# Patient Record
Sex: Male | Born: 1985 | Hispanic: Yes | Marital: Single | Smoking: Current some day smoker
Health system: Southern US, Community
[De-identification: ages and names within clinical notes are randomized; demographics above are authoritative.]

---

## 2016-05-29 ENCOUNTER — Encounter (HOSPITAL_COMMUNITY): Payer: Self-pay

## 2016-05-29 ENCOUNTER — Emergency Department (HOSPITAL_COMMUNITY): Payer: Self-pay

## 2016-05-29 ENCOUNTER — Emergency Department (HOSPITAL_COMMUNITY)
Admission: EM | Admit: 2016-05-29 | Discharge: 2016-05-29 | Disposition: A | Discharge status: Home / Self Care | Payer: Self-pay | Attending: Emergency Medicine | Admitting: Emergency Medicine

## 2016-05-29 DIAGNOSIS — Y990 Civilian activity done for income or pay: Secondary | ICD-10-CM | POA: Insufficient documentation

## 2016-05-29 DIAGNOSIS — W1830XA Fall on same level, unspecified, initial encounter: Secondary | ICD-10-CM | POA: Insufficient documentation

## 2016-05-29 DIAGNOSIS — S6391XA Sprain of unspecified part of right wrist and hand, initial encounter: Secondary | ICD-10-CM

## 2016-05-29 DIAGNOSIS — Y929 Unspecified place or not applicable: Secondary | ICD-10-CM | POA: Insufficient documentation

## 2016-05-29 DIAGNOSIS — Y939 Activity, unspecified: Secondary | ICD-10-CM | POA: Insufficient documentation

## 2016-05-29 NOTE — ED Notes (Signed)
Declined W/C at D/C and was escorted to lobby by RN. 

## 2016-05-29 NOTE — ED Triage Notes (Signed)
Patient fell yesterday hitting right hand. Complains of pain with ROM and abrasion to knuckles, positive distal pulses

## 2016-05-29 NOTE — ED Provider Notes (Signed)
[facility]-EMERGENCY DEPT Provider Note   CSN: [id number] Arrival date & time: 05/29/16  [id number]  By signing my name below, I, Marnette Burgessyan Andrew Long, attest that this documentation has been prepared under the direction and in the presence of Cyndel Griffey, PA-C. Electronically Signed: Marnette Burgessyan Andrew Long, Scribe. 05/29/2016. 10:53 AM.   History   Chief Complaint No chief complaint on file.  The history is provided by the patient. A language interpreter was used.    HPI Comments:  Adam Sweeney is a 31 y.o. male who presents to the Emergency Department complaining of sudden onset, constant right hand pain s/p a mechanical fall yesterday at work. He reports falling forward at ground level and using his right hand to catch his fall. He states associated abrasions to the right fourth finger. He did not try anything for relief of his pain at home PTA. Exerting the hand and ROM exacerbates his pain. Pt denies any other injuries at this time.   History reviewed. No pertinent past medical history.  There are no active problems to display for this patient.  History reviewed. No pertinent surgical history.  Home Medications    Prior to Admission medications   Not on File    Family History No family history on file.  Social History Social History  Substance Use Topics  . Smoking status: Never Smoker  . Smokeless tobacco: Never Used  . Alcohol use Yes    Allergies   Patient has no known allergies.   Review of Systems Review of Systems  Musculoskeletal: Positive for myalgias.  Skin: Positive for wound.     Physical Exam Updated Vital Signs BP (!) 135/92   Pulse 85   Temp 99.2 F (37.3 C) (Oral)   Resp 18   SpO2 100%   Physical Exam  Constitutional: He is oriented to person, place, and time. He appears well-developed and well-nourished.  HENT:  Head: Normocephalic.  Eyes: Conjunctivae are normal.  Cardiovascular: Normal rate.   Pulmonary/Chest: Effort normal.    Abdominal: He exhibits no distension.  Musculoskeletal: Normal range of motion.  Swelling noted to the MCP joint of the right hand of the second digit. Tenderness to palpation over this joint. Pain with second finger flexion and extension at the MCP joint. Normal finger distally. Cap refill less than 2 seconds. Normal rest of the hand.  Neurological: He is alert and oriented to person, place, and time.  Skin: Skin is warm and dry.  Psychiatric: He has a normal mood and affect.  Nursing note and vitals reviewed.    ED Treatments / Results  DIAGNOSTIC STUDIES:  Oxygen Saturation is 100% on RA, normal by my interpretation.    COORDINATION OF CARE:  10:49 AM Discussed treatment plan with pt at bedside including XR of right hand and pt agreed to plan.  Labs (all labs ordered are listed, but only abnormal results are displayed) Labs Reviewed - No data to display  EKG  EKG Interpretation None       Radiology Dg Hand Complete Right  Result Date: 05/29/2016 CLINICAL DATA:  Pain following fall EXAM: RIGHT HAND - COMPLETE 3+ VIEW COMPARISON:  None. FINDINGS: Frontal, oblique, and lateral views obtained. There is soft tissue swelling dorsally. No fracture or dislocation. Joint spaces appear normal. No erosive change. IMPRESSION: Soft tissue swelling dorsally. No fracture or dislocation. No evident arthropathy. Electronically Signed   By: Bretta BangWilliam  Woodruff III M.D.   On: 05/29/2016 09:37    Procedures Procedures (including critical care  time)  Medications Ordered in ED Medications - No data to display   Initial Impression / Assessment and Plan / ED Course  I have reviewed the triage vital signs and the nursing notes.  Pertinent labs & imaging results that were available during my care of the patient were reviewed by me and considered in my medical decision making (see chart for details).     Patient X-Ray negative for obvious fracture or dislocation.  Pt advised to follow up  with orthopedics. Patient given splint while in ED, conservative therapy recommended and discussed. Patient will be discharged home & is agreeable with above plan. Returns precautions discussed. Pt appears safe for discharge.    Patient in emergency department with right hand injury after a fall yesterday. X-rays negative. Patient does have minimal swelling and tenderness over MCP joint of the second finger. Less likely a sprain. Will treat with immobilization with a finger splint. Advised to ice, elevate, NSAIDs. Follow-up as needed. Patient is neurologically intact and stable for discharge home. No other injuries   Vitals:   05/29/16 0923  BP: (!) 135/92  Pulse: 85  Resp: 18  Temp: 99.2 F (37.3 C)  TempSrc: Oral  SpO2: 100%    Final Clinical Impressions(s) / ED Diagnoses   Final diagnoses:  Hand sprain, right, initial encounter    New Prescriptions New Prescriptions   No medications on file    I personally performed the services described in this documentation, which was scribed in my presence. The recorded information has been reviewed and is accurate.     Jaynie Crumble, PA-C 05/29/16 1128    Derwood Kaplan, MD 05/29/16 1723

## 2016-05-29 NOTE — Discharge Instructions (Signed)
Ice and elevate your hand. Ibuprofen or aleve for pain. Use finger splint as needed. Follow up with family doctor.

## 2018-10-01 ENCOUNTER — Emergency Department (HOSPITAL_COMMUNITY)
Admission: EM | Admit: 2018-10-01 | Discharge: 2018-10-01 | Disposition: A | Discharge status: Home / Self Care | Payer: HRSA Program | Attending: Emergency Medicine | Admitting: Emergency Medicine

## 2018-10-01 ENCOUNTER — Emergency Department (HOSPITAL_COMMUNITY): Payer: HRSA Program

## 2018-10-01 DIAGNOSIS — R05 Cough: Secondary | ICD-10-CM | POA: Diagnosis present

## 2018-10-01 DIAGNOSIS — J1289 Other viral pneumonia: Secondary | ICD-10-CM | POA: Insufficient documentation

## 2018-10-01 DIAGNOSIS — U071 COVID-19: Secondary | ICD-10-CM | POA: Insufficient documentation

## 2018-10-01 DIAGNOSIS — J189 Pneumonia, unspecified organism: Secondary | ICD-10-CM

## 2018-10-01 MED ORDER — ACETAMINOPHEN 325 MG PO TABS
650.0000 mg | ORAL_TABLET | Freq: Once | ORAL | Status: AC
  Administered 2018-10-01: 650 mg via ORAL
  Filled 2018-10-01: qty 2

## 2018-10-01 MED ORDER — AZITHROMYCIN 250 MG PO TABS: 250.0000 mg | ORAL_TABLET | Freq: Every day | ORAL | 0 refills | Status: DC

## 2018-10-01 NOTE — ED Triage Notes (Signed)
Pt has cough, fever body aches for over 8 days. pts girlfriend has same sxs.  Pt also reports a productive cough that makes him feel sob.

## 2018-10-01 NOTE — ED Provider Notes (Signed)
[facility]   CSN: [id number] Arrival date & time: 10/01/18  1232    History   Chief Complaint Chief Complaint  Patient presents with  . Cough  . Generalized Body Aches    HPI Adam Sweeney is a 33 y.o. male.     HPI   33 year old male with no significant past medical history presents today with complaints of cough.  Patient notes over the last 8 days he has had a cough, body aches, fever.  He notes that when he coughs he feels for short of breath.  He also notes that when he talks and gets worked up he gets short of breath.  Patient denies any close sick contacts other than his girlfriend who is having similar symptoms.  He notes taking aspirin in the early morning hours today.  He is a non-smoker.  No past medical history on file.  There are no active problems to display for this patient.   No past surgical history on file.      Home Medications    Prior to Admission medications   Medication Sig Start Date End Date Taking? Authorizing Provider  azithromycin (ZITHROMAX) 250 MG tablet Take 1 tablet (250 mg total) by mouth daily. Take first 2 tablets together, then 1 every day until finished. 10/01/18   Eyvonne MechanicHedges, Nashya Garlington, PA-C    Family History No family history on file.  Social History Social History   Tobacco Use  . Smoking status: Never Smoker  . Smokeless tobacco: Never Used  Substance Use Topics  . Alcohol use: Yes  . Drug use: Not on file     Allergies   Patient has no known allergies.   Review of Systems Review of Systems  All other systems reviewed and are negative.    Physical Exam Updated Vital Signs BP 127/66 (BP Location: Right Arm)   Pulse (!) 115   Temp (!) 101 F (38.3 C) (Oral)   Resp 18   SpO2 99%   Physical Exam Vitals signs and nursing note reviewed.  Constitutional:      Appearance: He is well-developed.  HENT:     Head: Normocephalic and atraumatic.   Eyes:     General: No scleral icterus.       Right eye: No discharge.        Left eye: No discharge.     Conjunctiva/sclera: Conjunctivae normal.     Pupils: Pupils are equal, round, and reactive to light.  Neck:     Musculoskeletal: Normal range of motion.     Vascular: No JVD.     Trachea: No tracheal deviation.  Pulmonary:     Effort: Pulmonary effort is normal. No respiratory distress.     Breath sounds: Normal breath sounds. No stridor. No wheezing or rales.  Neurological:     Mental Status: He is alert and oriented to person, place, and time.     Coordination: Coordination normal.  Psychiatric:        Behavior: Behavior normal.        Thought Content: Thought content normal.        Judgment: Judgment normal.      ED Treatments / Results  Labs (all labs ordered are listed, but only abnormal results are displayed) Labs Reviewed  NOVEL CORONAVIRUS, NAA (HOSPITAL ORDER, SEND-OUT TO REF LAB)    EKG None  Radiology Dg Chest Portable 1 View  Result Date: 10/01/2018 CLINICAL DATA:  Body aches fever and  cough for 8 days EXAM: PORTABLE CHEST 1 VIEW COMPARISON:  None. FINDINGS: The mediastinal contour and cardiac size are within normal limits. Patchy consolidations are identified in the left mid lung left lung base. There is a minimal left pleural effusion. The bony structures are stable. IMPRESSION: Patchy consolidations are identified in the left mid lung and left lung base with minimal left pleural effusion. Findings are suspicious for pneumonia. Electronically Signed   By: Abelardo Diesel M.D.   On: 10/01/2018 15:47    Procedures Procedures (including critical care time)  Medications Ordered in ED Medications  acetaminophen (TYLENOL) tablet 650 mg (650 mg Oral Given 10/01/18 1546)     Initial Impression / Assessment and Plan / ED Course  I have reviewed the triage vital signs and the nursing notes.  Pertinent labs & imaging results that were available during my care  of the patient were reviewed by me and considered in my medical decision making (see chart for details).       Labs:   Imaging: Chest port   Consults:  Therapeutics:  Discharge Meds: azithromycin   Assessment/Plan: 33 year old male presents today with likely viral respiratory infection.  I have high suspicion for COVID-19.  His girlfriend is having similar symptoms and is currently being seen in the emergency room.  He has no significant past medical history is very well-appearing.  He slightly tachycardic and febrile here.  He will be given Tylenol, COVID tested and undergo chest x-ray.  Chest x-ray returned showing likely pneumonia, I still have high suspicion for COVID but will cover with azithromycin for potential bacterial etiology.  Patient will return immediately if he develops any new or worsening signs or symptoms.  Verbalized understanding and agreement to today's plan.  Phone interpreter was used throughout evaluation, in conjunction with his girlfriend who speaks [place].   Final Clinical Impressions(s) / ED Diagnoses   Final diagnoses:  Community acquired pneumonia of left lung, unspecified part of lung    ED Discharge Orders         Ordered    azithromycin (ZITHROMAX) 250 MG tablet  Daily     10/01/18 1631           Okey Regal, PA-C 10/01/18 1738    Pattricia Boss, MD 10/03/18 [phone]

## 2018-10-01 NOTE — Discharge Instructions (Addendum)
Please read attached information. If you experience any new or worsening signs or symptoms please return to the emergency room for evaluation. Please follow-up with your primary care provider or specialist as discussed. Please use medication prescribed only as directed and discontinue taking if you have any concerning signs or symptoms.   °

## 2018-10-03 LAB — NOVEL CORONAVIRUS, NAA (HOSP ORDER, SEND-OUT TO REF LAB; TAT 18-24 HRS): SARS-CoV-2, NAA: DETECTED — AB

## 2018-10-24 ENCOUNTER — Other Ambulatory Visit: Payer: Self-pay

## 2018-10-24 DIAGNOSIS — Z20822 Contact with and (suspected) exposure to covid-19: Secondary | ICD-10-CM

## 2018-10-25 LAB — NOVEL CORONAVIRUS, NAA: SARS-CoV-2, NAA: NOT DETECTED

## 2020-02-12 ENCOUNTER — Ambulatory Visit: Payer: Self-pay | Admitting: Physician Assistant

## 2020-02-12 ENCOUNTER — Ambulatory Visit: Payer: Self-pay | Admitting: Family Medicine

## 2020-07-05 ENCOUNTER — Emergency Department (HOSPITAL_COMMUNITY)
Admission: EM | Admit: 2020-07-05 | Discharge: 2020-07-05 | Disposition: A | Discharge status: Home / Self Care | Payer: Self-pay | Attending: Emergency Medicine | Admitting: Emergency Medicine

## 2020-07-05 ENCOUNTER — Emergency Department (HOSPITAL_COMMUNITY): Payer: Self-pay

## 2020-07-05 ENCOUNTER — Encounter (HOSPITAL_COMMUNITY): Payer: Self-pay | Admitting: Emergency Medicine

## 2020-07-05 ENCOUNTER — Other Ambulatory Visit: Payer: Self-pay

## 2020-07-05 DIAGNOSIS — M25522 Pain in left elbow: Secondary | ICD-10-CM | POA: Insufficient documentation

## 2020-07-05 DIAGNOSIS — M25532 Pain in left wrist: Secondary | ICD-10-CM | POA: Insufficient documentation

## 2020-07-05 DIAGNOSIS — W010XXA Fall on same level from slipping, tripping and stumbling without subsequent striking against object, initial encounter: Secondary | ICD-10-CM | POA: Insufficient documentation

## 2020-07-05 DIAGNOSIS — Y990 Civilian activity done for income or pay: Secondary | ICD-10-CM | POA: Insufficient documentation

## 2020-07-05 DIAGNOSIS — M25512 Pain in left shoulder: Secondary | ICD-10-CM | POA: Insufficient documentation

## 2020-07-05 DIAGNOSIS — M79602 Pain in left arm: Secondary | ICD-10-CM | POA: Insufficient documentation

## 2020-07-05 DIAGNOSIS — Y93F2 Activity, caregiving, lifting: Secondary | ICD-10-CM | POA: Insufficient documentation

## 2020-07-05 DIAGNOSIS — F17200 Nicotine dependence, unspecified, uncomplicated: Secondary | ICD-10-CM | POA: Insufficient documentation

## 2020-07-05 DIAGNOSIS — W19XXXA Unspecified fall, initial encounter: Secondary | ICD-10-CM

## 2020-07-05 MED ORDER — IBUPROFEN 600 MG PO TABS: 600.0000 mg | ORAL_TABLET | Freq: Four times a day (QID) | ORAL | 0 refills | Status: DC | PRN

## 2020-07-05 NOTE — ED Provider Notes (Signed)
Emergency Medicine Provider Triage Evaluation Note  Adam Sweeney 35 y.o. male was evaluated in triage.  Pt complains of LUE pain after a fall that occurred last week.  Reports that he was carrying something and tripped and landed on his left arm.  He states that since then, he has had pain to his left upper extremity from his shoulder all the way down.  He denies any new trauma, injury.  He states it hurts more when he moves it.  Denies any numbness/weakness.  Review of Systems  Positive: Left upper extremity pain Negative: Numbness/weakness.  Physical Exam  BP 134/82   Pulse 70   Temp 98.2 F (36.8 C) (Oral)   Resp 18   Ht 5\' 4"  (1.626 m)   Wt 65.8 kg   SpO2 100%   BMI 24.89 kg/m  Gen:   Awake, no distress  \ HEENT:  Atraumatic.  No midline C-spine tenderness. Resp:  Normal effort  Cardiac:  Normal rate.  2+ radial pulses bilaterally MSK:   Moves extremities without difficulty.  Tenderness palpation of the left shoulder, left upper arm, left elbow, forearm, left wrist.  No deformity or crepitus noted.  Passive range of motion intact but does report pain when doing so.  No tenderness palpation on left upper extremity. Neuro:  Speech clear.  Equal grip strength bilaterally.  5/5 strength bilateral upper extremity.  Medical Decision Making  Medically screening exam initiated at 3:55 AM.  Appropriate orders placed.  Adam Sweeney was informed that the remainder of the evaluation will be completed by another provider, this initial triage assessment does not replace that evaluation, and the importance of remaining in the ED until their evaluation is complete.  Clinical Impression  LUE pain   Portions of this note were generated with Dragon dictation software. Dictation errors may occur despite best attempts at proofreading.     Ursula Beath, PA-C 07/05/20 1726    09/04/20, DO 07/06/20 0008

## 2020-07-05 NOTE — ED Triage Notes (Signed)
Translator/pt stated, I fell last Thursday and hurt my left arm my shoulder all the way down my arm. Unable to move much. Pt tripped and feel and weight of my body fell on the left.

## 2020-07-05 NOTE — ED Provider Notes (Signed)
[name]   CSN: [id number] Arrival date & time: 07/05/20  1632     History Chief Complaint  Patient presents with  . Fall  . Arm Pain   Patient has family member at bedside.  He declines translator and would rather have family translate.  Adam Sweeney is a 35 y.o. male.  HPI   35 year old male presenting to the emergency department today for evaluation of left upper extremity pain.  Patient was at work 1 week ago when he was lifting something lost his balance and fell onto his left upper extremity.  He is reporting pain to the left shoulder, left elbow and left wrist.  He reports some decrease sensation to that side.  He denies any head trauma or loss of consciousness.  He denies any other injuries at this time.    History reviewed. No pertinent past medical history.  There are no problems to display for this patient.   History reviewed. No pertinent surgical history.     No family history on file.  Social History   Tobacco Use  . Smoking status: Current Some Day Smoker  . Smokeless tobacco: Never Used  Substance Use Topics  . Alcohol use: Yes  . Drug use: Not Currently    Home Medications Prior to Admission medications   Medication Sig Start Date End Date Taking? Authorizing Provider  ibuprofen (ADVIL) 600 MG tablet Take 1 tablet (600 mg total) by mouth every 6 (six) hours as needed. 07/05/20  Yes Namiyah Grantham S, PA-C  azithromycin (ZITHROMAX) 250 MG tablet Take 1 tablet (250 mg total) by mouth daily. Take first 2 tablets together, then 1 every day until finished. 10/01/18   Eyvonne Mechanic, PA-C    Allergies    Patient has no known allergies.  Review of Systems   Review of Systems  Constitutional: Negative for fever.  Musculoskeletal:       Lue pain  Skin: Negative for color change.  Neurological: Positive for numbness. Negative for weakness.    Physical Exam Updated Vital Signs BP  (!) 133/96 (BP Location: Right Arm)   Pulse 80   Temp 98.9 F (37.2 C) (Oral)   Resp 17   SpO2 100%   Physical Exam Constitutional:      General: He is not in acute distress.    Appearance: He is well-developed.  Eyes:     Conjunctiva/sclera: Conjunctivae normal.  Cardiovascular:     Rate and Rhythm: Normal rate.  Pulmonary:     Effort: Pulmonary effort is normal.  Musculoskeletal:     Comments: ttp to the left shoulder, left elbow and left wrist. No ttp to the anatomical snuff box. nvi distally. 5/5 strength to the bue. Neg empty can test.  Skin:    General: Skin is warm and dry.  Neurological:     Mental Status: He is alert and oriented to person, place, and time.     ED Results / Procedures / Treatments   Labs (all labs ordered are listed, but only abnormal results are displayed) Labs Reviewed - No data to display  EKG None  Radiology DG Elbow Complete Left  Result Date: 07/05/2020 CLINICAL DATA:  Pain status post fall. EXAM: LEFT FOREARM - 2 VIEW; LEFT HAND - COMPLETE 3+ VIEW; LEFT ELBOW - COMPLETE 3+ VIEW; LEFT SHOULDER - 2+ VIEW COMPARISON:  None. FINDINGS: There is no evidence of fracture or other focal bone lesions. Soft tissues are unremarkable. IMPRESSION: No  acute osseous abnormality involving the patient's left upper extremity from the shoulder to the hand. Electronically Signed   By: Katherine Mantle M.D.   On: 07/05/2020 18:57   DG Forearm Left  Result Date: 07/05/2020 CLINICAL DATA:  Pain status post fall. EXAM: LEFT FOREARM - 2 VIEW; LEFT HAND - COMPLETE 3+ VIEW; LEFT ELBOW - COMPLETE 3+ VIEW; LEFT SHOULDER - 2+ VIEW COMPARISON:  None. FINDINGS: There is no evidence of fracture or other focal bone lesions. Soft tissues are unremarkable. IMPRESSION: No acute osseous abnormality involving the patient's left upper extremity from the shoulder to the hand. Electronically Signed   By: Katherine Mantle M.D.   On: 07/05/2020 18:57   DG Shoulder Left  Result  Date: 07/05/2020 CLINICAL DATA:  Pain status post fall. EXAM: LEFT FOREARM - 2 VIEW; LEFT HAND - COMPLETE 3+ VIEW; LEFT ELBOW - COMPLETE 3+ VIEW; LEFT SHOULDER - 2+ VIEW COMPARISON:  None. FINDINGS: There is no evidence of fracture or other focal bone lesions. Soft tissues are unremarkable. IMPRESSION: No acute osseous abnormality involving the patient's left upper extremity from the shoulder to the hand. Electronically Signed   By: Katherine Mantle M.D.   On: 07/05/2020 18:57   DG Hand Complete Left  Result Date: 07/05/2020 CLINICAL DATA:  Pain status post fall. EXAM: LEFT FOREARM - 2 VIEW; LEFT HAND - COMPLETE 3+ VIEW; LEFT ELBOW - COMPLETE 3+ VIEW; LEFT SHOULDER - 2+ VIEW COMPARISON:  None. FINDINGS: There is no evidence of fracture or other focal bone lesions. Soft tissues are unremarkable. IMPRESSION: No acute osseous abnormality involving the patient's left upper extremity from the shoulder to the hand. Electronically Signed   By: Katherine Mantle M.D.   On: 07/05/2020 18:57    Procedures Procedures   Medications Ordered in ED Medications - No data to display  ED Course  I have reviewed the triage vital signs and the nursing notes.  Pertinent labs & imaging results that were available during my care of the patient were reviewed by me and considered in my medical decision making (see chart for details).    MDM Rules/Calculators/A&P                           35 year old male presenting the emergency department today for evaluation of pain to the left upper extremity after a fall last week.  His strength, sensation are intact to the left upper extremity though he does have some tenderness throughout the shoulder elbow and wrist.  He has no tenderness over the anatomical snuffbox to suggest scaphoid fracture.  X-rays of the left elbow, forearm, shoulder and hand are negative for any acute abnormality.  He will be placed in a wrist splint for comfort and he also requested to have a sling  for comfort.  Have advised anti-inflammatories and Ortho follow-up.  Advised on return precautions.  He voices understanding the plan and reasons to return.  All questions answered.  Patient stable for discharge.  Final Clinical Impression(s) / ED Diagnoses Final diagnoses:  Fall, initial encounter  Left arm pain    Rx / DC Orders ED Discharge Orders         Ordered    ibuprofen (ADVIL) 600 MG tablet  Every 6 hours PRN        07/05/20 2252           Karrie Meres, PA-C 07/05/20 2252    Tegeler, Canary Brim, MD 07/06/20 [phone]

## 2020-07-05 NOTE — Discharge Instructions (Signed)
You may alternate taking Tylenol and Ibuprofen as needed for pain control. You may take 400-600 mg of ibuprofen every 6 hours and [phone] mg of Tylenol every 6 hours. Do not exceed 4000 mg of Tylenol daily as this can lead to liver damage. Also, make sure to take Ibuprofen with meals as it can cause an upset stomach. Do not take other NSAIDs while taking Ibuprofen such as (Aleve, Naprosyn, Aspirin, Celebrex, etc) and do not take more than the prescribed dose as this can lead to ulcers and bleeding in your GI tract. You may use warm and cold compresses to help with your symptoms.   Please follow up with Dr. Susa Simmonds within the next 7-10 days for re-evaluation and further treatment of your symptoms.   Please return to the ER sooner if you have any new or worsening symptoms.   ----------------------------   Puede alternar la toma de Tylenol e ibuprofeno segn sea necesario para controlar el dolor. Puede tomar 400-600 mg de ibuprofeno cada 6 horas y [phone] mg de Tylenol cada 6 horas. No exceda los 4000 mg de Tylenol al da, ya que esto puede provocar dao heptico. Adems, asegrese de tomar ibuprofeno con las comidas, ya que puede causar Programme researcher, broadcasting/film/video. No tome otros AINE mientras toma ibuprofeno, como (Aleve, Naprosyn, Aspirin, Celebrex, etc.) y no tome ms de la dosis prescrita, ya que esto puede provocar lceras y sangrado en el tracto gastrointestinal. Puede usar compresas fras y calientes para ayudar con sus sntomas.  Haga un seguimiento con el Dr. Jeris Penta de los prximos 7 a 10 das para una reevaluacin y tratamiento adicional de sus sntomas.  Regrese a la sala de emergencias antes si tiene sntomas nuevos o que empeoran.

## 2020-07-06 NOTE — Progress Notes (Signed)
Orthopedic Tech Progress Note Patient Details:  Adam Sweeney 01-05-1986 [id number]  Ortho Devices Type of Ortho Device: Sling immobilizer,Velcro wrist splint Ortho Device/Splint Location: Left Upper Extremity Ortho Device/Splint Interventions: Ordered,Application,Adjustment   Post Interventions Patient Tolerated: Well Instructions Provided: Adjustment of device,Care of device,Poper ambulation with device   [name] P [name] 07/06/2020, 12:16 AM

## 2020-10-05 ENCOUNTER — Ambulatory Visit: Payer: Self-pay | Admitting: Nurse Practitioner

## 2020-11-02 ENCOUNTER — Other Ambulatory Visit: Payer: Self-pay | Admitting: Orthopedic Surgery

## 2020-11-02 DIAGNOSIS — M5412 Radiculopathy, cervical region: Secondary | ICD-10-CM

## 2021-03-07 ENCOUNTER — Emergency Department (HOSPITAL_COMMUNITY): Payer: Self-pay

## 2021-03-07 ENCOUNTER — Other Ambulatory Visit: Payer: Self-pay

## 2021-03-07 ENCOUNTER — Encounter (HOSPITAL_COMMUNITY): Payer: Self-pay | Admitting: Emergency Medicine

## 2021-03-07 ENCOUNTER — Emergency Department (HOSPITAL_COMMUNITY)
Admission: EM | Admit: 2021-03-07 | Discharge: 2021-03-08 | Disposition: A | Discharge status: Home / Self Care | Payer: Self-pay | Attending: Emergency Medicine | Admitting: Emergency Medicine

## 2021-03-07 DIAGNOSIS — W010XXA Fall on same level from slipping, tripping and stumbling without subsequent striking against object, initial encounter: Secondary | ICD-10-CM | POA: Insufficient documentation

## 2021-03-07 DIAGNOSIS — M62838 Other muscle spasm: Secondary | ICD-10-CM

## 2021-03-07 DIAGNOSIS — N5082 Scrotal pain: Secondary | ICD-10-CM | POA: Insufficient documentation

## 2021-03-07 DIAGNOSIS — N433 Hydrocele, unspecified: Secondary | ICD-10-CM | POA: Insufficient documentation

## 2021-03-07 DIAGNOSIS — M25562 Pain in left knee: Secondary | ICD-10-CM | POA: Insufficient documentation

## 2021-03-07 DIAGNOSIS — Y9289 Other specified places as the place of occurrence of the external cause: Secondary | ICD-10-CM | POA: Insufficient documentation

## 2021-03-07 DIAGNOSIS — N50812 Left testicular pain: Secondary | ICD-10-CM

## 2021-03-07 DIAGNOSIS — M545 Low back pain, unspecified: Secondary | ICD-10-CM | POA: Insufficient documentation

## 2021-03-07 DIAGNOSIS — M25552 Pain in left hip: Secondary | ICD-10-CM | POA: Insufficient documentation

## 2021-03-07 DIAGNOSIS — W19XXXA Unspecified fall, initial encounter: Secondary | ICD-10-CM

## 2021-03-07 MED ORDER — HYDROCODONE-ACETAMINOPHEN 5-325 MG PO TABS
1.0000 | ORAL_TABLET | Freq: Once | ORAL | Status: AC
  Administered 2021-03-07: 1 via ORAL
  Filled 2021-03-07: qty 1

## 2021-03-07 NOTE — ED Provider Notes (Signed)
Emergency Medicine Provider Triage Evaluation Note  Adam Sweeney , a 36 y.o. male  was evaluated in triage.  Pt complains of left knee, left hip, and lower back pain after a trip and fall at [employer].  Did not sustain any other injury.  No head injury.  No LOC.  Review of Systems  Positive:  Negative: See above   Physical Exam  BP (!) 139/93    Pulse 81    Temp 98.8 F (37.1 C) (Oral)    Resp 16    Ht 5\' 9"  (1.753 m)    Wt 90 kg    SpO2 95%    BMI 29.30 kg/m  Gen:   Awake, no distress   Resp:  Normal effort  MSK:   Left knee tenderness, left hip tenderness, and tenderness over the lumbar spine. Other:    Medical Decision Making  Medically screening exam initiated at 9:16 PM.  Appropriate orders placed.  Adam Sweeney was informed that the remainder of the evaluation will be completed by another provider, this initial triage assessment does not replace that evaluation, and the importance of remaining in the ED until their evaluation is complete.  Pain meds and imaging ordered.    Ursula Beath, PA-C 03/07/21 2117    2118, DO 03/08/21 [phone]

## 2021-03-07 NOTE — ED Triage Notes (Signed)
Patient slipped and fell at a local restaurant this evening , no LOC, reports pain at left lower back and left knee.

## 2021-03-08 ENCOUNTER — Emergency Department (HOSPITAL_COMMUNITY): Payer: Self-pay

## 2021-03-08 MED ORDER — FENTANYL CITRATE PF 50 MCG/ML IJ SOSY
50.0000 ug | PREFILLED_SYRINGE | Freq: Once | INTRAMUSCULAR | Status: AC
  Administered 2021-03-08: 50 ug via INTRAMUSCULAR
  Filled 2021-03-08: qty 1

## 2021-03-08 MED ORDER — IBUPROFEN 600 MG PO TABS: 600.0000 mg | ORAL_TABLET | Freq: Four times a day (QID) | ORAL | 0 refills | Status: DC | PRN

## 2021-03-08 MED ORDER — METHOCARBAMOL 500 MG PO TABS: 500.0000 mg | ORAL_TABLET | Freq: Four times a day (QID) | ORAL | 0 refills | Status: DC | PRN

## 2021-03-08 MED ORDER — METHOCARBAMOL 500 MG PO TABS
500.0000 mg | ORAL_TABLET | Freq: Once | ORAL | Status: AC
  Administered 2021-03-08: 500 mg via ORAL
  Filled 2021-03-08: qty 1

## 2021-03-08 NOTE — ED Notes (Signed)
Pt was outside when name was called, staff unaware that pt had stepped out.

## 2021-03-08 NOTE — ED Notes (Signed)
Pt called for vitals, no response. 

## 2021-03-08 NOTE — ED Notes (Signed)
PT called for vitals X2 with no response. Will attempt again shortly. °

## 2021-03-08 NOTE — ED Notes (Signed)
Pt wheeled to waiting room. Pt verbalized understanding of discharge instructions.   

## 2021-03-08 NOTE — ED Provider Notes (Signed)
[facility] EMERGENCY DEPARTMENT Provider Note   CSN: [id number] Arrival date & time: 03/07/21  2016     History  Chief Complaint  Patient presents with   Fall: Back Elyse Sweeney pain     Adam Sweeney is a 36 y.o. male.  Patient is a 36 yo male presenting for fall. Patient states he slipped and fell at [facility] last night. States he did a split like motion before landing on his left side. Admits to left sided back, hip, and inner upper thigh pain. Denies head trauma, loc, or blood thinner use. Admits to left scrotal pain.  The history is provided by the patient and the spouse. A language interpreter was used.      Home Medications Prior to Admission medications   Medication Sig Start Date End Date Taking? Authorizing Provider  azithromycin (ZITHROMAX) 250 MG tablet Take 1 tablet (250 mg total) by mouth daily. Take first 2 tablets together, then 1 every day until finished. 10/01/18   Hedges, Dellis Filbert, PA-C  ibuprofen (ADVIL) 600 MG tablet Take 1 tablet (600 mg total) by mouth every 6 (six) hours as needed. 07/05/20   Couture, Cortni S, PA-C      Allergies    Patient has no known allergies.    Review of Systems   Review of Systems  Constitutional:  Negative for chills and fever.  HENT:  Negative for ear pain and sore throat.   Eyes:  Negative for pain and visual disturbance.  Respiratory:  Negative for cough and shortness of breath.   Cardiovascular:  Negative for chest pain and palpitations.  Gastrointestinal:  Negative for abdominal pain and vomiting.  Genitourinary:  Positive for testicular pain. Negative for dysuria, hematuria and scrotal swelling.  Musculoskeletal:  Positive for back pain and gait problem. Negative for arthralgias.       Left hip pain   Skin:  Negative for color change and rash.  Neurological:  Negative for seizures and syncope.  All other systems reviewed and are negative.  Physical Exam Updated Vital Signs BP (!) 143/100 (BP  Location: Left Arm)    Pulse 87    Temp 97.7 F (36.5 C)    Resp 18    Ht 5\' 9"  (1.753 m)    Wt 90 kg    SpO2 99%    BMI 29.30 kg/m  Physical Exam Vitals and nursing note reviewed.  Constitutional:      General: He is not in acute distress.    Appearance: He is well-developed.  HENT:     Head: Normocephalic and atraumatic.  Eyes:     Conjunctiva/sclera: Conjunctivae normal.  Cardiovascular:     Rate and Rhythm: Normal rate and regular rhythm.     Pulses:          Dorsalis pedis pulses are 2+ on the right side and 2+ on the left side.     Heart sounds: No murmur heard. Pulmonary:     Effort: Pulmonary effort is normal. No respiratory distress.     Breath sounds: Normal breath sounds.  Abdominal:     Palpations: Abdomen is soft.     Tenderness: There is no abdominal tenderness.  Genitourinary:    Comments: Left sided testicular pain. No gross blood. No fluid collections or swelling visualized. Musculoskeletal:        General: No swelling.     Cervical back: Neck supple. No bony tenderness.     Thoracic back: No bony tenderness.  Lumbar back: Spasms and tenderness present. No bony tenderness.       Back:     Right hip: No bony tenderness.     Left hip: Bony tenderness present.     Right upper leg: Normal.     Left upper leg: Normal.     Right knee: Normal.     Left knee: Bony tenderness present.     Right lower leg: Normal.     Left lower leg: Normal.     Right ankle: Normal.     Left ankle: Normal.  Skin:    General: Skin is warm and dry.     Capillary Refill: Capillary refill takes less than 2 seconds.  Neurological:     Mental Status: He is alert.  Psychiatric:        Mood and Affect: Mood normal.    ED Results / Procedures / Treatments   Labs (all labs ordered are listed, but only abnormal results are displayed) Labs Reviewed - No data to display  EKG None  Radiology DG Lumbar Spine Complete  Result Date: 03/07/2021 CLINICAL DATA:  Status post fall.  EXAM: LUMBAR SPINE - COMPLETE 4+ VIEW COMPARISON:  None. FINDINGS: There is no evidence of lumbar spine fracture. Alignment is normal. Very early degenerative changes seen along the anterior aspect of the superior endplate of the L4 vertebral body. Intervertebral disc spaces are maintained. IMPRESSION: No acute osseous abnormality. Electronically Signed   By: Virgina Norfolk M.D.   On: 03/07/2021 22:13   DG Knee Complete 4 Views Left  Result Date: 03/07/2021 CLINICAL DATA:  Left knee pain.  Fall. EXAM: LEFT KNEE - COMPLETE 4+ VIEW COMPARISON:  None. FINDINGS: No evidence of fracture, dislocation, or joint effusion. No evidence of arthropathy or other focal bone abnormality. Soft tissues are unremarkable. IMPRESSION: Negative. Electronically Signed   By: Ronney Asters M.D.   On: 03/07/2021 22:11   DG Hip Unilat W or Wo Pelvis 2-3 Views Left  Result Date: 03/07/2021 CLINICAL DATA:  Status post fall. EXAM: DG HIP (WITH OR WITHOUT PELVIS) 2-3V LEFT COMPARISON:  None. FINDINGS: There is no evidence of hip fracture or dislocation. There is no evidence of arthropathy or other focal bone abnormality. IMPRESSION: Negative. Electronically Signed   By: Virgina Norfolk M.D.   On: 03/07/2021 22:11    Procedures Procedures    Medications Ordered in ED Medications  HYDROcodone-acetaminophen (NORCO/VICODIN) 5-325 MG per tablet 1 tablet (1 tablet Oral Given 03/07/21 2131)    ED Course/ Medical Decision Making/ A&P                           Medical Decision Making  9:34 AM 36 yo male presenting for fall without head trauma. Patient is Aox3, no acute distress, resting in chair, with stable vitals. Patient requesting male physician for physical exam. Male PA on shift evaluated lower back, GU, and upper thigh exam and has assisted in completing patient's chart for physical exam. Physical exam pertinent for left lumbar paraspinal pain. No midline lumbar spinal pain. Patient neurovascularly intact.  Patient also  has scrotal pain from split like motion. Tenderness to palpation of left scrotum. No discoloration or fluid collections noted on exam.  Xray of lumbar spine, left hip, and left knee demonstrate no acute process-no fractures. Robaxin and Motrin 600 mg given for pain. Prescription sent to pharmancy. Testicular US ordered for traumatic scrotal pain. Small bilateral hydroceles present.  Patient in no  distress and overall condition improved here in the ED. Detailed discussions were had with the patient regarding current findings, and need for close f/u with PCP or on call doctor. The patient has been instructed to return immediately if the symptoms worsen in any way for re-evaluation. Patient verbalized understanding and is in agreement with current care plan. All questions answered prior to discharge.           Final Clinical Impression(s) / ED Diagnoses Final diagnoses:  Scrotal pain  Acute left-sided low back pain without sciatica  Muscle spasm  Fall, initial encounter  Testicular pain, left  Hydrocele, unspecified hydrocele type    Rx / DC Orders ED Discharge Orders     None         Lianne Cure, DO [phone]

## 2021-04-06 NOTE — Progress Notes (Deleted)
Patient ID: Adam Sweeney, male   DOB: 07-Apr-1985, 36 y.o.   MRN: [id number]   ED 03/15/2021 for scrotal pain after a fall.    From ED A/P: 36 yo male presenting for fall without head trauma. Patient is Aox3, no acute distress, resting in chair, with stable vitals. Patient requesting male physician for physical exam. Male PA on shift evaluated lower back, GU, and upper thigh exam and has assisted in completing patient's chart for physical exam. Physical exam pertinent for left lumbar paraspinal pain. No midline lumbar spinal pain. Patient neurovascularly intact.   Patient also has scrotal pain from split like motion. Tenderness to palpation of left scrotum. No discoloration or fluid collections noted on exam.   Xray of lumbar spine, left hip, and left knee demonstrate no acute process-no fractures. Robaxin and Motrin 600 mg given for pain. Prescription sent to pharmancy. Testicular US ordered for traumatic scrotal pain. Small bilateral hydroceles present.

## 2021-04-07 ENCOUNTER — Inpatient Hospital Stay: Payer: Self-pay | Admitting: Physician Assistant

## 2021-04-07 ENCOUNTER — Ambulatory Visit: Payer: Self-pay | Admitting: *Deleted

## 2021-04-07 NOTE — Telephone Encounter (Signed)
Per agent: "Pt fell in a restaurant and hurt his hip.   Pt in a lot of pain. wife calling, pt had appt 9:30 today and thought appt was virtual.  She is not sure what he is suppose to do."    Chief Complaint: Left hip pain Symptoms: 9/10 Frequency: Fall occurred 03/07/21 Pertinent Negatives: Patient denies  Disposition: [] ED /[x] Urgent Care (no appt availability in office) / [] Appointment(In office/virtual)/ []  [facility]/ [] Home Care/ [] Refused Recommended Disposition /[] [place] [facility]/ []  Follow-up with PCP Additional Notes: Advised [facility] Pt had appt today at 0930. "Thought it was virtual." Reason for Disposition  [1] MODERATE weakness (i.e., interferes with work, school, normal activities) AND [2] new-onset or worsening    Hip pain S/P fall 03/07/21  Answer Assessment - Initial Assessment Questions 1. MECHANISM: "How did the fall happen?"     Slipped on water on floor 2. DOMESTIC VIOLENCE AND ELDER ABUSE SCREENING: "Did you fall because someone pushed you or tried to hurt you?" If Yes, ask: "Are you safe now?"     no 3. ONSET: "When did the fall happen?" (e.g., minutes, hours, or days ago)     Mar 07 2021 4. LOCATION: "What part of the body hit the ground?" (e.g., back, buttocks, head, hips, knees, hands, head, stomach)     Left hip 5. INJURY: "Did you hurt (injure) yourself when you fell?" If Yes, ask: "What did you injure? Tell me more about this?" (e.g., body area; type of injury; pain severity)"     "No fractures" Seen in ED. Given muscle relaxers. Testicle "Leaking" 6. PAIN: "Is there any pain?" If Yes, ask: "How bad is the pain?" (e.g., Scale 1-10; or mild,  moderate, severe)   - NONE (0): No pain   - MILD (1-3): Doesn't interfere with normal activities    - MODERATE (4-7): Interferes with normal activities or awakens from sleep    - SEVERE (8-10): Excruciating pain, unable to do any normal activities      9/10 7. SIZE: For cuts, bruises, or  swelling, ask: "How large is it?" (e.g., inches or centimeters)        9. OTHER SYMPTOMS: "Do you have any other symptoms?" (e.g., dizziness, fever, weakness; new onset or worsening).      no 10. CAUSE: "What do you think caused the fall (or falling)?" (e.g., tripped, dizzy spell)       Slipped  Protocols used: Falls and [facility]

## 2021-04-07 NOTE — Telephone Encounter (Addendum)
Scheduled an appt at [facility]. Patient states their appointment was canceled today. There was miscommunication about the appt being inperson or virtual. They were under the impression it was virtual. When they called they were told it was inperson.   They were offered a virtual appt but preferred an inperson appt.  New appt schedule and was reminded of new site. Patient verbalized understanding.

## 2021-04-08 ENCOUNTER — Other Ambulatory Visit: Payer: Self-pay

## 2021-04-08 ENCOUNTER — Ambulatory Visit: Payer: Self-pay | Attending: Internal Medicine | Admitting: Internal Medicine

## 2021-04-08 ENCOUNTER — Encounter: Payer: Self-pay | Admitting: Internal Medicine

## 2021-04-08 VITALS — BP 120/77 | HR 100 | Resp 16 | Wt 191.0 lb

## 2021-04-08 DIAGNOSIS — M545 Low back pain, unspecified: Secondary | ICD-10-CM

## 2021-04-08 DIAGNOSIS — N50811 Right testicular pain: Secondary | ICD-10-CM

## 2021-04-08 DIAGNOSIS — N50812 Left testicular pain: Secondary | ICD-10-CM

## 2021-04-08 DIAGNOSIS — M25562 Pain in left knee: Secondary | ICD-10-CM

## 2021-04-08 MED ORDER — TRAMADOL HCL 50 MG PO TABS
50.0000 mg | ORAL_TABLET | Freq: Three times a day (TID) | ORAL | 0 refills | Status: DC | PRN
  Filled 2021-04-08: qty 30, 10d supply, fill #0

## 2021-04-08 MED ORDER — IBUPROFEN 600 MG PO TABS
600.0000 mg | ORAL_TABLET | Freq: Four times a day (QID) | ORAL | 0 refills | Status: AC | PRN
  Filled 2021-04-08: qty 30, 8d supply, fill #0

## 2021-04-08 MED ORDER — METHOCARBAMOL 500 MG PO TABS
500.0000 mg | ORAL_TABLET | Freq: Three times a day (TID) | ORAL | 0 refills | Status: DC | PRN
  Filled 2021-04-08: qty 20, 7d supply, fill #0

## 2021-04-08 NOTE — Progress Notes (Signed)
Has hip and scrotal pain after fall 1 month ago in January

## 2021-04-08 NOTE — Progress Notes (Signed)
Patient ID: Adam Sweeney Sweeney, male    DOB: 23-Jun-1985  MRN: [id number]  CC: Hip Pain, Fall, and Groin Swelling   Subjective: Adam Sweeney is a 36 y.o. male who presents for new patient follow-up visit from the emergency room.  His wife, Adam Sweeney, is with him.  Patient wanted wife to interpret.  I told him that we have an interpretation service that I would rather use.  However they both wanted her to interpret.  She served as Equities traderinterpreter for this encounter. His concerns today include:   Patient was seen in the emergency room 03/08/2021 complaining of left-sided back, hip and left scrotal pain after he slipped and fell at [employer] the night before.  Patient tells me that he fell with a slit like motion landing on his left knee and the right leg being thrown out from under him. In the emergency room, he had tenderness to palpation to the left scrotum with no discoloration or fluid collection noted on exam.  Testicular ultrasound revealed hydroceles.  X-ray of the lumbar spine revealed some early degenerative changes but no acute findings.  X-ray of the left hip and knee negative for fracture.  Patient discharged with ibuprofen and Robaxin.  Today: Patient reports he still has significant pain across the lower back with movement of his trunk.  Also worse with sitting, laying down and walking.  Endorses tingling in the left leg for one day 6 days ago. -Still having significant pain in the left knee.  He states that he fell on the left knee.  He has had decreased range of motion in the knee due to pain. -Continued pain in the scrotum left greater than right.  Worse with prolonged sitting and urination.  I inquired whether he noticed any enlargement.  He tells me that the scrotum feels enlarged if he sits too much. -He was taking ibuprofen 200 mg 2  tabs every 3-4 hours.  He is out of the Robaxin. -He works Counselling psychologistconstruction doing framing work.  He has not been able to work since the  fall due to ongoing pain.    No current outpatient medications on file prior to visit.   No current facility-administered medications on file prior to visit.    No Known Allergies  Social History   Socioeconomic History   Marital status: Single    Spouse name: Not on file   Number of children: Not on file   Years of education: Not on file   Highest education level: Not on file  Occupational History   Not on file  Tobacco Use   Smoking status: Some Days   Smokeless tobacco: Never  Substance and Sexual Activity   Alcohol use: Yes   Drug use: Not Currently   Sexual activity: Not on file  Other Topics Concern   Not on file  Social History Narrative   Not on file   Social Determinants of Health   Financial Resource Strain: Not on file  Food Insecurity: Not on file  Transportation Needs: Not on file  Physical Activity: Not on file  Stress: Not on file  Social Connections: Not on file  Intimate Partner Violence: Not on file    No family history on file.  No past surgical history on file.  ROS: Review of Systems Negative except as stated above  PHYSICAL EXAM: BP 120/77 (BP Location: Right Arm, Patient Position: Sitting, Cuff Size: Large)    Pulse 100    Resp 16  Wt 191 lb (86.6 kg)    SpO2 98%    BMI 28.21 kg/m   Physical Exam  General appearance -young Hispanic male in NAD.  However he looks uncomfortable sitting on the exam table. Mental status -patient answers questions appropriately but is very talkative and has to be redirected several times to answer the question asked.   GU Male - RN Sharlet Salina present: Patient declined examination of his scrotum stating that he was embarrassed.  He asked several times whether it was really necessary.  I told him that it would be important to take a look to see if there has been any significant changes or swelling.  Patient still declined. Neurological -power in the lower extremities: 5/5 on the right.  LLE: proximal/distal   4/5 - patient gives to pain Musculoskeletal - mild-mod tenderness on palpation of mid to lower thoracic vertebrae.  Mild tenderness on palpation of mid thoracic paraspinal muscles all the way down into the lumbar region.  Attempted straight leg raise on the left side causes pain at about 30 degrees Knee: No edema or erythema noted.  No tenderness on palpation along the medial and lateral joint lines.  Patient with limited range of motion with passive flexion and extension due to pain.  Significant discomfort with attempted resisted range of motion.  ASSESSMENT AND PLAN: 1. Acute bilateral low back pain without sciatica Patient still having significant discomfort 1 month out from his fall.  Based on history he did have some radicular 6 days ago that has since resolved.  We will get an x-ray of the thoracic spine.  Discussed referring him to orthopedics and for some physical therapy.  Recommend heating pad to the back.  Refill given on ibuprofen and Robaxin.  Advised that Robaxin can cause some drowsiness.  I have given a limited course of tramadol also for pain.  Advised that the medicine can cause drowsiness.  [place] controlled substance reporting system reviewed. - Ambulatory referral to Physical Therapy - Ambulatory referral to Orthopedic Surgery - methocarbamol (ROBAXIN) 500 MG tablet; Take 1 tablet (500 mg total) by mouth every 8 (eight) hours as needed for muscle spasms.  Dispense: 20 tablet; Refill: 0 - ibuprofen (ADVIL) 600 MG tablet; Take 1 tablet (600 mg total) by mouth every 6 (six) hours as needed.  Dispense: 30 tablet; Refill: 0 - traMADol (ULTRAM) 50 MG tablet; Take 1 tablet (50 mg total) by mouth every 8 (eight) hours as needed.  Dispense: 30 tablet; Refill: 0 - DG Thoracic Spine W/Swimmers; Future  2. Acute pain of left knee I recommend an MRI of the knee to rule out traumatic structure in particular meniscus.  Patient agreeable to this. - MR Knee Left  Wo Contrast; Future -  Ambulatory referral to Orthopedic Surgery - methocarbamol (ROBAXIN) 500 MG tablet; Take 1 tablet (500 mg total) by mouth every 8 (eight) hours as needed for muscle spasms.  Dispense: 20 tablet; Refill: 0 - ibuprofen (ADVIL) 600 MG tablet; Take 1 tablet (600 mg total) by mouth every 6 (six) hours as needed.  Dispense: 30 tablet; Refill: 0 - traMADol (ULTRAM) 50 MG tablet; Take 1 tablet (50 mg total) by mouth every 8 (eight) hours as needed.  Dispense: 30 tablet; Refill: 0  3. Pain in both testicles Since patient declined having me examine his scrotum, I recommended referral to urology for further evaluation.  Patient is agreeable to this. - Ambulatory referral to Urology - methocarbamol (ROBAXIN) 500 MG tablet; Take 1 tablet (500  mg total) by mouth every 8 (eight) hours as needed for muscle spasms.  Dispense: 20 tablet; Refill: 0 - ibuprofen (ADVIL) 600 MG tablet; Take 1 tablet (600 mg total) by mouth every 6 (six) hours as needed.  Dispense: 30 tablet; Refill: 0 - traMADol (ULTRAM) 50 MG tablet; Take 1 tablet (50 mg total) by mouth every 8 (eight) hours as needed.  Dispense: 30 tablet; Refill: 0    Patient was given the opportunity to ask questions.  Patient verbalized understanding of the plan and was able to repeat key elements of the plan.   Orders Placed This Encounter  Procedures   MR Knee Left  Wo Contrast   DG Thoracic Spine W/Swimmers   Ambulatory referral to Urology   Ambulatory referral to Physical Therapy   Ambulatory referral to Orthopedic Surgery     Requested Prescriptions   Signed Prescriptions Disp Refills   methocarbamol (ROBAXIN) 500 MG tablet 20 tablet 0    Sig: Take 1 tablet (500 mg total) by mouth every 8 (eight) hours as needed for muscle spasms.   ibuprofen (ADVIL) 600 MG tablet 30 tablet 0    Sig: Take 1 tablet (600 mg total) by mouth every 6 (six) hours as needed.   traMADol (ULTRAM) 50 MG tablet 30 tablet 0    Sig: Take 1 tablet (50 mg total) by mouth every  8 (eight) hours as needed.    No follow-ups on file.  Jonah Blue, MD, FACP

## 2021-04-12 ENCOUNTER — Ambulatory Visit (INDEPENDENT_AMBULATORY_CARE_PROVIDER_SITE_OTHER): Payer: Self-pay | Admitting: Urology

## 2021-04-12 ENCOUNTER — Other Ambulatory Visit: Payer: Self-pay

## 2021-04-12 ENCOUNTER — Encounter: Payer: Self-pay | Admitting: Urology

## 2021-04-12 VITALS — BP 128/82 | HR 89 | Ht 69.0 in | Wt 190.0 lb

## 2021-04-12 DIAGNOSIS — N50819 Testicular pain, unspecified: Secondary | ICD-10-CM

## 2021-04-12 NOTE — Patient Instructions (Signed)
Dolor plvico en hombres Pelvic Pain, Male El dolor plvico se siente en la parte inferior del abdomen, debajo del ombligo y a nivel de las caderas. El dolor puede comenzar en forma repentina (ser agudo), reaparecer (recurrente) o durar mucho tiempo (volverse crnico). El dolor plvico que dura ms de seis meses se considera crnico. Hay muchas causas posibles del dolor plvico. A veces, la causa es desconocida. El dolor plvico puede afectar: La prstata. El sistema urinario. El tubo digestivo. El sistema musculoesqueltico. Los msculos o ligamentos distendidos pueden causar dolor plvico. Siga estas indicaciones en su casa: Medicamentos Tome los medicamentos de venta libre y los recetados solamente como se lo haya indicado el mdico. Si le recetaron un antibitico, tmelo como se lo haya indicado el mdico. No deje de tomar el antibitico, aunque comience a Sports administrator. Control del dolor, la rigidez y la hinchazn  Tome baos de agua caliente (baos de asiento). Los baos de asiento ayudan a [employer] del suelo plvico. En un bao de [place], el agua solamente [place] caderas y cubre las nalgas. Un bao de asiento puede [employer] hogar en la baera o en una tina porttil para bao de asiento que se coloca sobre el inodoro. Si se lo indican, aplique calor en la zona afectada antes de realizar ejercicios. Use la fuente de calor que el mdico le recomiende, como una compresa de calor hmedo o una almohadilla trmica. Coloque una toalla entre la piel y la fuente de Freight forwarder. Aplique calor durante 20 a 30 minutos. Retire la fuente de calor si la piel se pone de color rojo brillante. Esto es especialmente importante si no puede sentir dolor, calor o fro. Puede correr un riesgo mayor de sufrir quemaduras. Indicaciones generales Haga reposo como se lo haya indicado el mdico. Lleve un registro del dolor plvico. Escriba los siguientes datos: Cundo comenz Conservation officer, historic buildings. La  ubicacin del dolor. Qu parece mejorar o Occupational hygienist. Cualquier sntoma que se presente junto con Conservation officer, historic buildings. Siga el plan de tratamiento como se lo haya indicado el mdico. Esto puede incluir: Realizar fisioterapia plvica. Yoga, meditacin y [place] fsica. Biorretroalimentacin. Este proceso lo capacita para Aeronautical engineer respuesta del cuerpo Insurance risk surveyor) a travs de tcnicas de respiracin y mtodos de relajacin. Usted trabajar con un terapeuta mientras se usan mquinas para controlar sus sntomas fsicos. Acupuntura. Este es un tipo de tratamiento en el que se estimulan puntos especficos del cuerpo con agujas delgadas que se introducen a travs de la piel para Regulatory affairs officer. Concurra a todas las visitas de seguimiento como se lo haya indicado el mdico. Esto es importante. Comunquese con un mdico si: Los medicamentos no Forensic psychologist. El dolor regresa. Aparecen nuevos sntomas. Tiene fiebre o escalofros. Tiene estreimiento. Observa sangre en la orina o en las heces. Se siente dbil o siente que va a desvanecerse. Solicite ayuda inmediatamente si: Tiene un dolor repentino e intenso. El dolor empeora constantemente. Siente dolor intenso junto con fiebre, nuseas, vmitos o sudoracin excesiva. Resumen El dolor plvico se siente en la parte inferior del abdomen, debajo del ombligo y a nivel de las caderas. Hay muchas causas posibles del dolor plvico. A veces, la causa es desconocida. Tome los medicamentos de venta libre y los recetados solamente como se lo haya indicado el mdico. Si le recetaron un antibitico, tmelo como se lo haya indicado el mdico. No deje de tomar el antibitico aunque comience a sentirse mejor. Comunquese con un mdico  si tiene sntomas nuevos o los sntomas empeoran. Busque ayuda de inmediato si siente dolor intenso junto con fiebre, nuseas, vmitos o sudoracin excesiva. Concurra a todas las visitas de seguimiento como se lo  haya indicado el mdico. Esto es importante. Esta informacin no tiene Marine scientist el consejo del mdico. Asegrese de hacerle al mdico cualquier pregunta que tenga. Document Revised: 08/31/2017 Document Reviewed: 08/31/2017 Elsevier Patient Education  [place].

## 2021-04-12 NOTE — Progress Notes (Signed)
04/12/21 9:47 AM   Adam Sweeney 10/10/1985 [id number]  Referring provider:  Marcine Matar, MD [address] [place],  [place] [address] Chief Complaint  Patient presents with   Testicle Pain     HPI: Adam Sweeney is a 36 y.o.male who presents today for further evaluation of testicular pain in both testicles.   He was seen in the ED on 03/07/2021 for a fall. He slipped and fell at [facility] last night. States he did a split like motion before landing on his left side. He admitted to left scrotal pain. He underwent a scrotal ultrasound that showed small hydroceles but was otherwise unremarkable.   He is accompanied today by a [place] interpreter, Annice Pih. He reports that he slipped on water and hit his knee and did a split. He started to feel pulsing in his testicles after this fall, his left is more painful than his right. He reports that his pain is unchanged. He has taken medication but has not had relief. His pain is continuous. He denies any testicle pain before this fall.   He reports that when he voids and applies pressure it is painful. He denies blood in his urine.  No urinary difficulties.   PMH: History reviewed. No pertinent past medical history.  Surgical History: History reviewed. No pertinent surgical history.  Home Medications:  Allergies as of 04/12/2021   No Known Allergies      Medication List        Accurate as of April 12, 2021  9:47 AM. If you have any questions, ask your nurse or doctor.          STOP taking these medications    methocarbamol 500 MG tablet Commonly known as: ROBAXIN Stopped by: Vanna Scotland, MD   traMADol 50 MG tablet Commonly known as: ULTRAM Stopped by: Vanna Scotland, MD       TAKE these medications    ibuprofen 600 MG tablet Commonly known as: ADVIL Take 1 tablet (600 mg total) by mouth every 6 (six) hours as needed.        Allergies: No Known Allergies  Family  History: History reviewed. No pertinent family history.  Social History:  reports that he has been smoking. He has never used smokeless tobacco. He reports current alcohol use. He reports that he does not currently use drugs.   Physical Exam: BP 128/82    Pulse 89    Ht 5\' 9"  (1.753 m)    Wt 190 lb (86.2 kg)    BMI 28.06 kg/m   Constitutional:  Alert and oriented, No acute distress.  He does not appear uncomfortable today. HEENT: [place] AT, moist mucus membranes.  Trachea midline, no masses. Cardiovascular: No clubbing, cyanosis, or edema. Respiratory: Normal respiratory effort, no increased work of breathing. GU: No CVA tenderness, benign with mild tenderness along bilateral testicular cords and groin, bilateral descended testicles, uncircumcised phallus on orthotopic meatus.  Skin: No rashes, bruises or suspicious lesions. Neurologic: Grossly intact, no focal deficits, moving all 4 extremities. Psychiatric: Normal mood and affect.   Urinalysis UA negative  Pertinent Imaging: CLINICAL DATA:  Left testicular pain   EXAM: SCROTAL ULTRASOUND   DOPPLER ULTRASOUND OF THE TESTICLES   TECHNIQUE: Complete ultrasound examination of the testicles, epididymis, and other scrotal structures was performed. Color and spectral Doppler ultrasound were also utilized to evaluate blood flow to the testicles.   COMPARISON:  None.   FINDINGS: Right testicle   Measurements: 5.0 cm x 2.9  cm x 3.5 cm. No mass or microlithiasis visualized.   Left testicle   Measurements: 4.5 cm x 3.0 cm x 3.4 cm. No mass or microlithiasis visualized.   Right epididymis:  Normal in size and appearance.   Left epididymis:  Normal in size and appearance.   Hydrocele:  Small bilateral hydroceles.   Varicocele:  None visualized.   Pulsed Doppler interrogation of both testes demonstrates normal low resistance arterial and venous waveforms bilaterally.   IMPRESSION: Small bilateral hydroceles. Otherwise,  unremarkable scrotal ultrasound.     Electronically Signed   By: Lesia Hausen M.D.   On: 03/08/2021 11:11  Scrotal ultrasound was personally reviewed.  Agree with radiologic interpretation.   Assessment & Plan:    Left/ right testicular pain - Exam today was unremarkable and ultrasound was reassuring, essentially benign exam and ultrasound - He was reassured  and anticipate improvement; If he fails to see improvement, could consider physical therapy although at this point in time, suspect pain will be self-limited in the absence of swelling, bruising, testicular injury, etc. - We discussed differential diagnosis of muscle strain secondary to recent fall.  - Recommend he continue compressive care such as compressive underwear. Discussed NSAIDS.   F/u as needed   I,Kailey Littlejohn,acting as a scribe for Vanna Scotland, MD.,have documented all relevant documentation on the behalf of Vanna Scotland, MD,as directed by  Vanna Scotland, MD while in the presence of Vanna Scotland, MD.  I have reviewed the above documentation for accuracy and completeness, and I agree with the above.   Vanna Scotland, MD   [facility] [address] [place], [place] [address] [phone]

## 2021-04-13 ENCOUNTER — Other Ambulatory Visit: Payer: Self-pay

## 2021-04-15 ENCOUNTER — Other Ambulatory Visit: Payer: Self-pay

## 2021-04-19 ENCOUNTER — Ambulatory Visit: Payer: Self-pay | Admitting: Orthopaedic Surgery

## 2021-04-21 ENCOUNTER — Ambulatory Visit: Payer: Self-pay | Attending: Internal Medicine

## 2021-04-21 NOTE — Therapy (Deleted)
OUTPATIENT PHYSICAL THERAPY THORACOLUMBAR EVALUATION   Patient Name: Adam Sweeney MRN: [id number] DOB:1985/04/26, 36 y.o., male Today's Date: 04/21/2021    No past medical history on file. No past surgical history on file. There are no problems to display for this patient.   PCP: Pcp, No  REFERRING PROVIDER: Marcine Matar, MD  REFERRING DIAG: M54.50 (ICD-10-CM) - Acute bilateral low back pain without sciatica   THERAPY DIAG:  Acute bilateral low back pain without sciatica  ONSET DATE: 03/07/2021  SUBJECTIVE:                                                                                                                                                                                           SUBJECTIVE STATEMENT: *** PERTINENT HISTORY:  36 yo male presenting for fall without head trauma. Patient is Aox3, no acute distress, resting in chair, with stable vitals. Patient requesting male physician for physical exam. Male PA on shift evaluated lower back, GU, and upper thigh exam and has assisted in completing patient's chart for physical exam. Physical exam pertinent for left lumbar paraspinal pain. No midline lumbar spinal pain. Patient neurovascularly intact.   Patient also has scrotal pain from split like motion. Tenderness to palpation of left scrotum. No discoloration or fluid collections noted on exam.   Xray of lumbar spine, left hip, and left knee demonstrate no acute process-no fractures. Robaxin and Motrin 600 mg given for pain. Prescription sent to [facility]armancy. Testicular US ordered for traumatic scrotal pain. Small bilateral hydroceles present.   Patient in no distress and overall condition improved here in the ED. Detailed discussions were had with the patient regarding current findings, and need for close f/u with PCP or on call doctor. The patient has been instructed to return immediately if the symptoms worsen in any way for re-evaluation. Patient  verbalized understanding and is in agreement with current care plan. All questions answered prior to discharge.    PAIN:  Are you having pain? {yes/no:20286} NPRS scale: ***/10 Pain location: *** Pain orientation: {Pain Orientation:25161}  PAIN TYPE: {type:313116} Pain description: {PAIN DESCRIPTION:21022940}  Aggravating factors: *** Relieving factors: ***  PRECAUTIONS: None  WEIGHT BEARING RESTRICTIONS No  FALLS:  Has patient fallen in last 6 months? Yes, Number of falls: 1  LIVING ENVIRONMENT: Lives with: {[facility] lives with:25569::"lives with their family"} Lives in: {Lives in:25570} Stairs: {yes/no:20286}; {Stairs:24000} Has following equipment at home: {Assistive devices:23999}  OCCUPATION: ***  PLOF: Independent  PATIENT GOALS ***   OBJECTIVE:   DIAGNOSTIC FINDINGS:  Xray of lumbar spine, left hip, and left knee demonstrate no acute process-no fractures. Robaxin and Motrin 600 mg  given for pain. Prescription sent to ph[facility]cy. Testicular US ordered for traumatic scrotal pain. Small bilateral hydroceles present.  PATIENT SURVEYS:  {rehab surveys:24030}  SCREENING FOR RED FLAGS: Bowel or bladder incontinence: {Yes/No:304960894}  COGNITION:  Overall cognitive status: Within functional limits for tasks assessed     SENSATION:  Light touch: {intact/deficits:24005}  Stereognosis: {intact/deficits:24005}  Hot/Cold: {intact/deficits:24005}  Proprioception: {intact/deficits:24005}  MUSCLE LENGTH: Hamstrings: Right *** deg; Left *** deg Thomas test: Right *** deg; Left *** deg  POSTURE:  ***  PALPATION: ***  LUMBARAROM/PROM  A/PROM A/PROM  04/21/2021  Flexion   Extension   Right lateral flexion   Left lateral flexion   Right rotation   Left rotation    (Blank rows = not tested)  LE AROM/PROM:  A/PROM Right 04/21/2021 Left 04/21/2021  Hip flexion    Hip extension    Hip abduction    Hip adduction    Hip internal rotation    Hip external  rotation    Knee flexion    Knee extension    Ankle dorsiflexion    Ankle plantarflexion    Ankle inversion    Ankle eversion     (Blank rows = not tested)  LE MMT:  MMT Right 04/21/2021 Left 04/21/2021  Hip flexion    Hip extension    Hip abduction    Hip adduction    Hip internal rotation    Hip external rotation    Knee flexion    Knee extension    Ankle dorsiflexion    Ankle plantarflexion    Ankle inversion    Ankle eversion     (Blank rows = not tested)  LUMBAR SPECIAL TESTS:  {lumbar special test:25242}  FUNCTIONAL TESTS:  {Functional tests:24029}  GAIT: Distance walked: *** Assistive device utilized: {Assistive devices:23999} Level of assistance: {Levels of assistance:24026} Comments: ***    TODAY'S TREATMENT  ***   PATIENT EDUCATION:  Education details: *** Person educated: {Person educated:25204} Education method: {Education Method:25205} Education comprehension: {Education Comprehension:25206}   HOME EXERCISE PROGRAM: ***  ASSESSMENT:  CLINICAL IMPRESSION: Patient is a *** y.o. *** who was seen today for physical therapy evaluation and treatment for ***.    OBJECTIVE IMPAIRMENTS {opptimpairments:25111}.   ACTIVITY LIMITATIONS {activity limitations:25113}.   PERSONAL FACTORS {Personal factors:25162} are also affecting patient's functional outcome.    REHAB POTENTIAL: {rehabpotential:25112}  CLINICAL DECISION MAKING: {clinical decision making:25114}  EVALUATION COMPLEXITY: {Evaluation complexity:25115}   GOALS: Goals reviewed with patient? {yes/no:20286}  SHORT TERM GOALS:  STG Name Target Date Goal status  1 *** Baseline:  {follow up:25551} {GOALSTATUS:25110}  2 *** Baseline:  {follow up:25551} {GOALSTATUS:25110}  3 *** Baseline: {follow up:25551} {GOALSTATUS:25110}  4 *** Baseline: {follow up:25551} {GOALSTATUS:25110}  5 *** Baseline: {follow up:25551} {GOALSTATUS:25110}  6 *** Baseline: {follow up:25551}  {GOALSTATUS:25110}  7 *** Baseline: {follow up:25551} {GOALSTATUS:25110}   LONG TERM GOALS:   LTG Name Target Date Goal status  1 *** Baseline: {follow up:25551} {GOALSTATUS:25110}  2 *** Baseline: {follow up:25551} {GOALSTATUS:25110}  3 *** Baseline: {follow up:25551} {GOALSTATUS:25110}  4 *** Baseline: {follow up:25551} {GOALSTATUS:25110}  5 *** Baseline: {follow up:25551} {GOALSTATUS:25110}  6 *** Baseline: {follow up:25551} {GOALSTATUS:25110}  7 *** Baseline: {follow up:25551} {GOALSTATUS:25110}   PLAN: PT FREQUENCY: {rehab frequency:25116}  PT DURATION: {rehab duration:25117}  PLANNED INTERVENTIONS: {rehab planned interventions:25118::"Therapeutic exercises","Therapeutic activity","Neuro Muscular re-education","Balance training","Gait training","Patient/Family education","Joint mobilization"}  PLAN FOR NEXT SESSION: ***   Hildred Laser, PT 04/21/2021, 8:38 AM

## 2021-04-22 ENCOUNTER — Ambulatory Visit (HOSPITAL_COMMUNITY): Payer: Self-pay

## 2021-04-22 ENCOUNTER — Inpatient Hospital Stay (HOSPITAL_COMMUNITY): Admission: RE | Admit: 2021-04-22 | Payer: Self-pay | Source: Ambulatory Visit

## 2021-09-28 ENCOUNTER — Telehealth: Payer: Self-pay | Admitting: Emergency Medicine

## 2021-09-28 NOTE — Telephone Encounter (Signed)
Copied from [place] [id number]. Topic: Medical Record Request - Other >> Sep 28, 2021  8:55 AM Everette C wrote: Reason for CRM: Tai with [facility] has called to follow up on a previous request for patient records and bills   Records have been received however past bills have not been received   The records that were received were submitted with password protection on disk form and Tai has been unable to review the information   Please contact further

## 2023-08-13 IMAGING — US US SCROTUM W/ DOPPLER COMPLETE
1 series · 14 of 25 positions shown · non-contrast
Comparison: None.

CLINICAL DATA: Left testicular pain

EXAM:
SCROTAL ULTRASOUND
DOPPLER ULTRASOUND OF THE TESTICLES
TECHNIQUE: Complete ultrasound examination of the testicles, epididymis, and
other scrotal structures was performed. Color and spectral Doppler
ultrasound were also utilized to evaluate blood flow to the
testicles.

[Series 1: us scrotum w/doppler · 64 acquisitions, 14 frames shown]
[im 1/64]
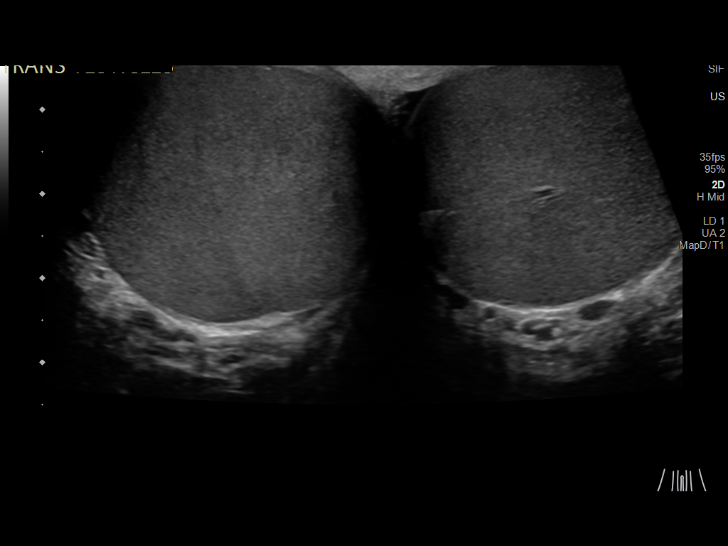
[im 6/64]
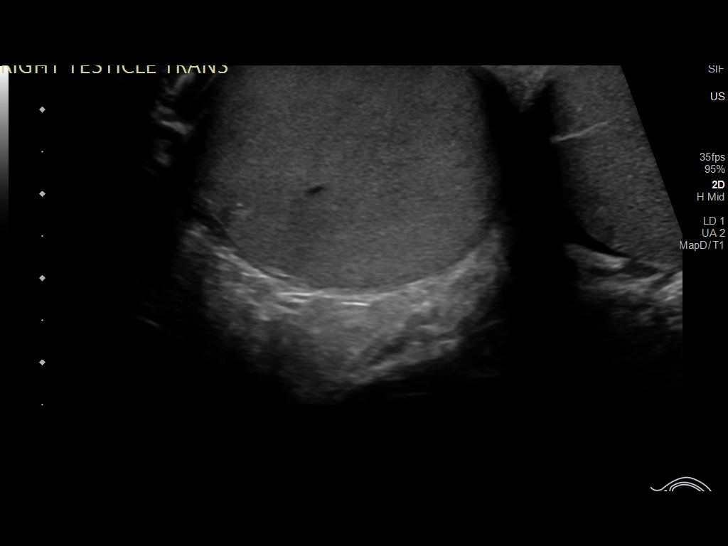
[im 11/64]
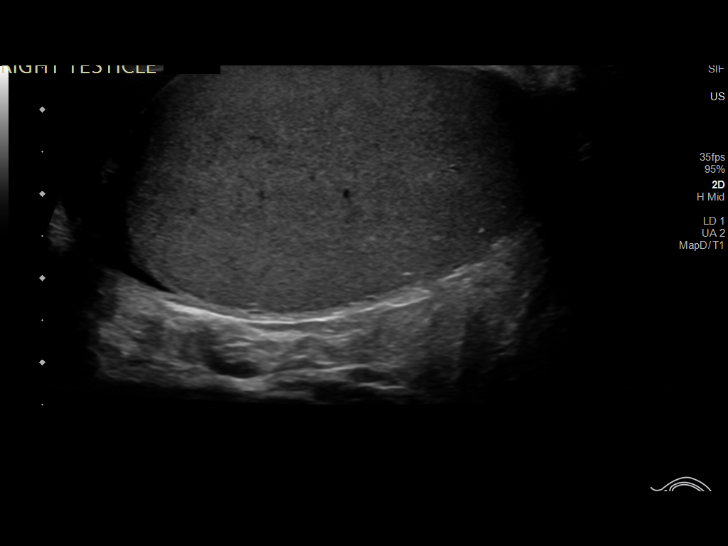
[im 16/64]
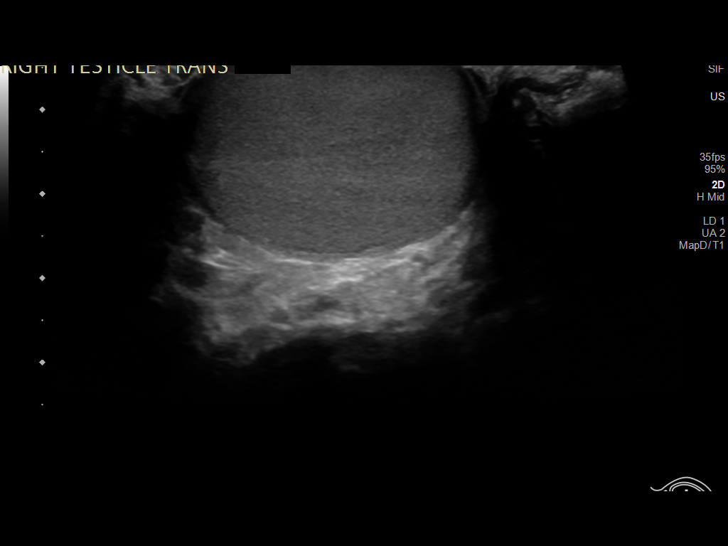
[im 22/64]
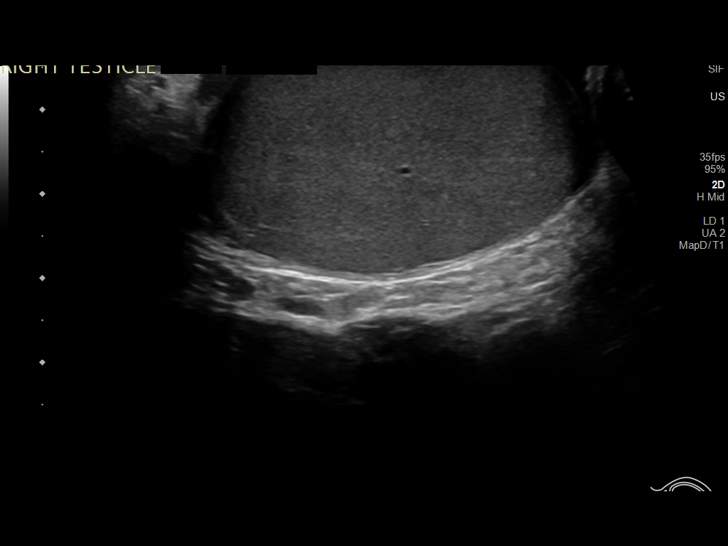
[im 24/64]
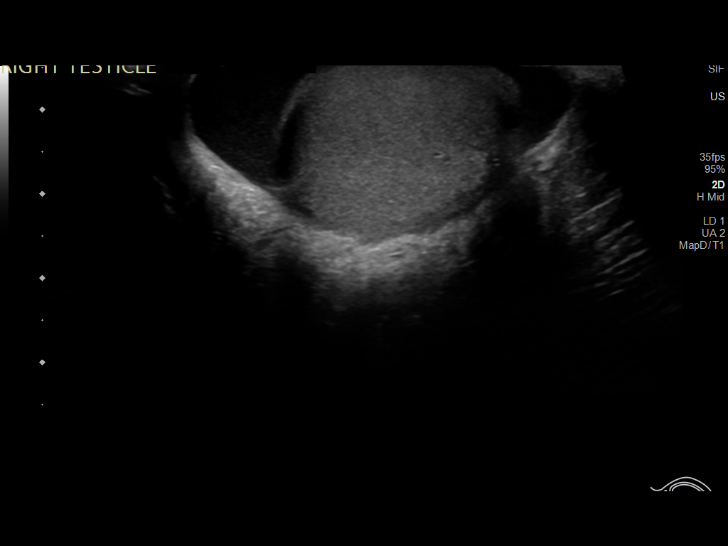
[im 29/64]
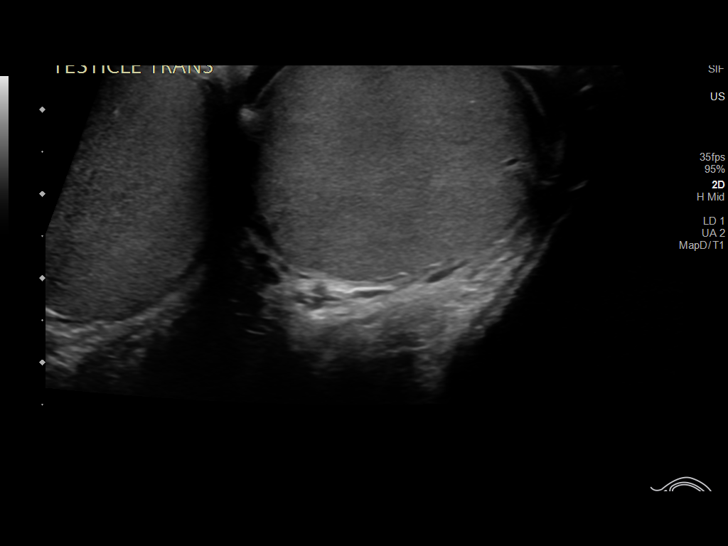
[im 35/64]
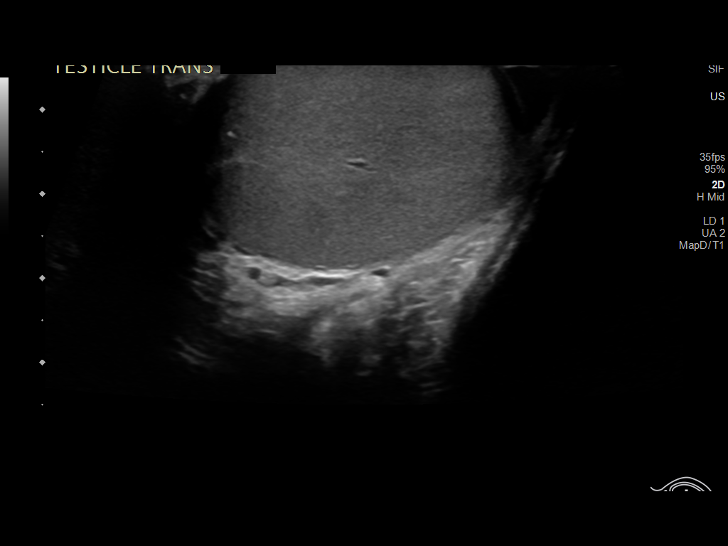
[im 40/64]
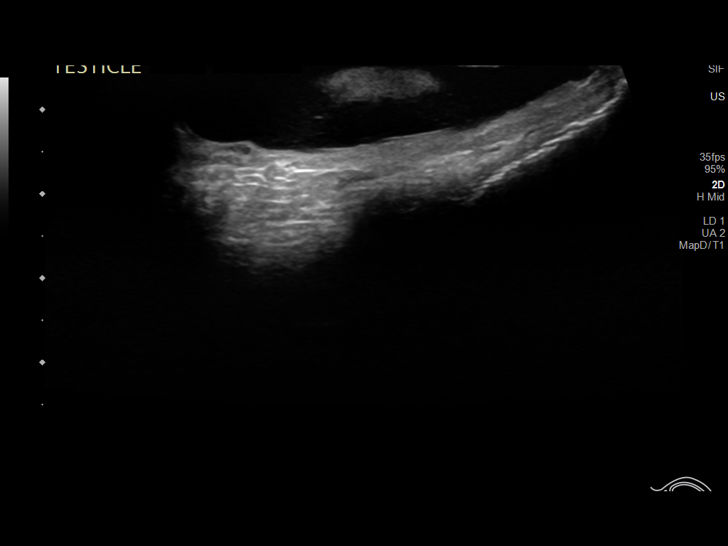
[im 43/64]
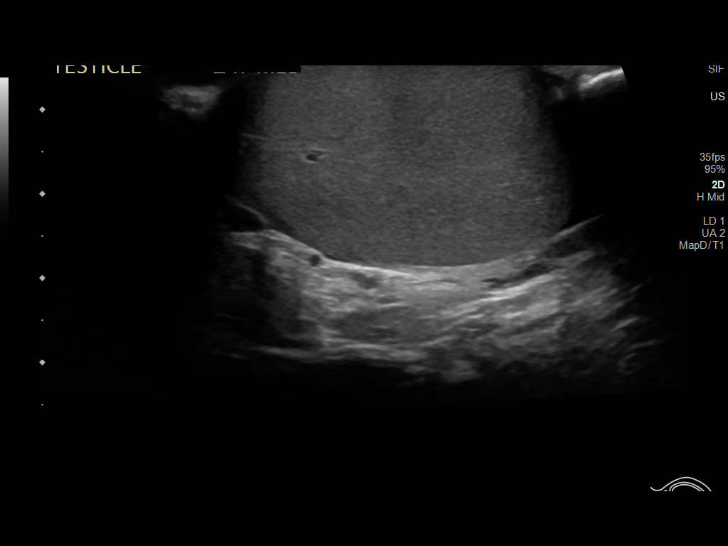
[im 48/64]
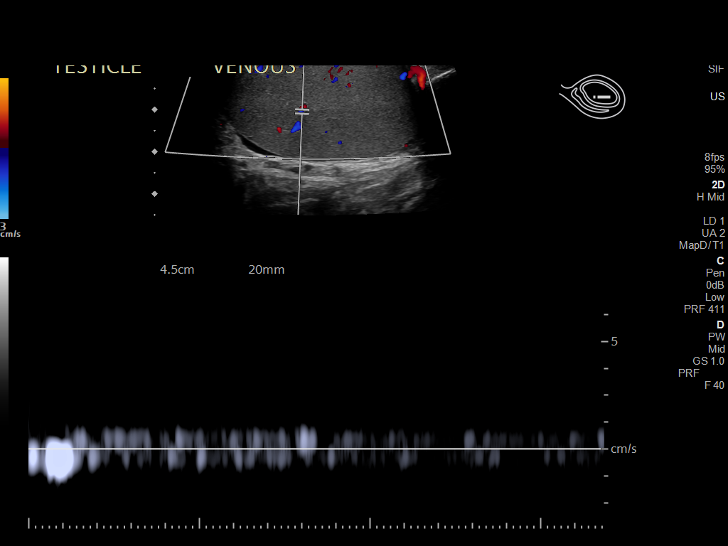
[im 53/64]
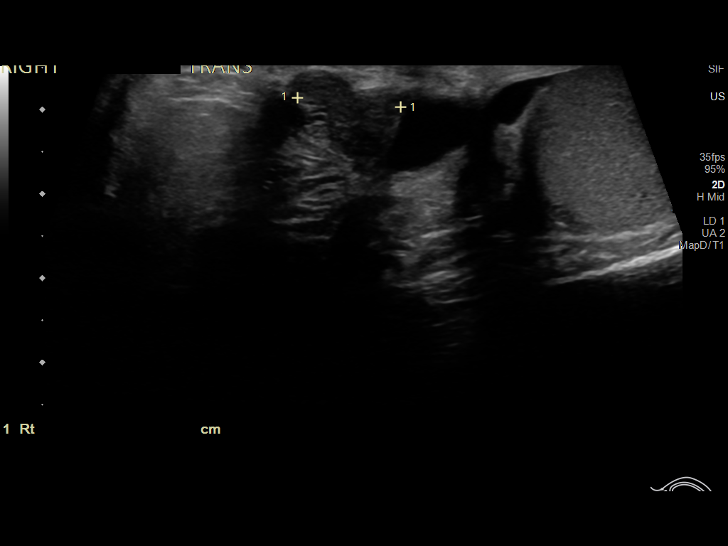
[im 58/64]
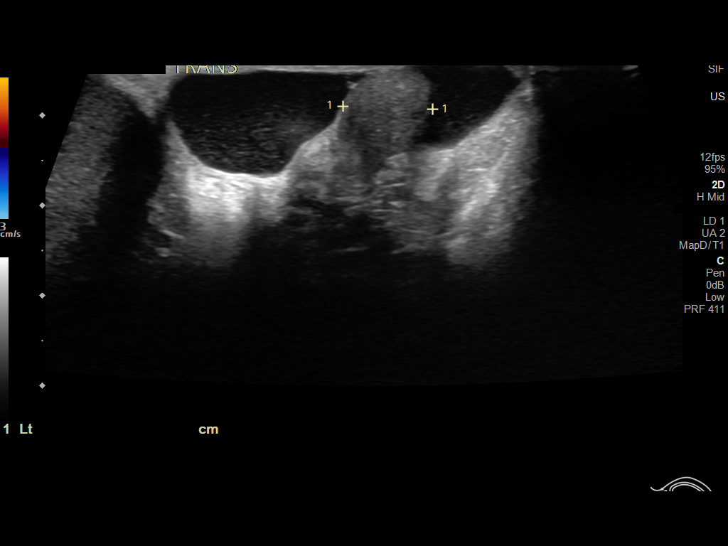
[im 64/64]
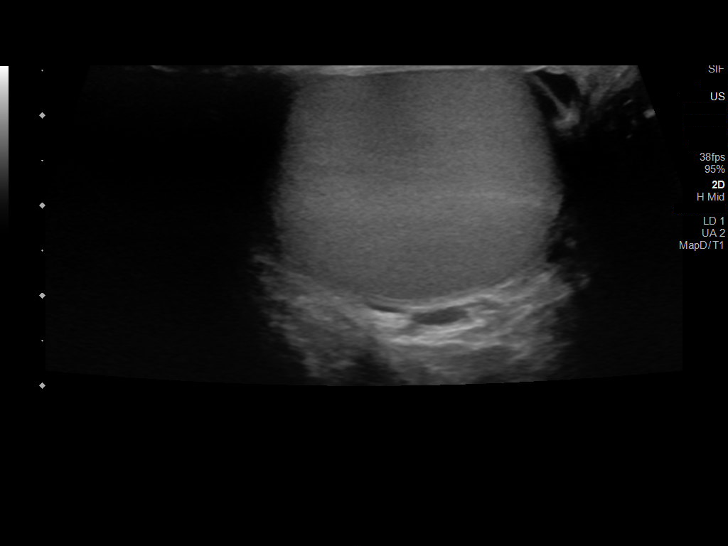

[14 of 25 positions shown; findings below may reference images not displayed]

FINDINGS: Right testicle

Measurements: 5.0 cm x 2.9 cm x 3.5 cm. No mass or microlithiasis
visualized.

Left testicle

Measurements: 4.5 cm x 3.0 cm x 3.4 cm. No mass or microlithiasis
visualized.

Right epididymis:  Normal in size and appearance.

Left epididymis:  Normal in size and appearance.

Hydrocele:  Small bilateral hydroceles.

Varicocele:  None visualized.

Pulsed Doppler interrogation of both testes demonstrates normal low
resistance arterial and venous waveforms bilaterally.
IMPRESSION: Small bilateral hydroceles. Otherwise, unremarkable scrotal
ultrasound.
